# Patient Record
Sex: Male | Born: 2005
Health system: Southern US, Community
[De-identification: ages and names within clinical notes are randomized; demographics above are authoritative.]

---
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
## 2024-06-06 DIAGNOSIS — R6889 Other general symptoms and signs: Secondary | ICD-10-CM | POA: Diagnosis not present

## 2024-06-06 DIAGNOSIS — J101 Influenza due to other identified influenza virus with other respiratory manifestations: Secondary | ICD-10-CM | POA: Diagnosis not present

## 2024-06-13 DIAGNOSIS — J029 Acute pharyngitis, unspecified: Secondary | ICD-10-CM | POA: Diagnosis not present

## 2024-06-13 DIAGNOSIS — R070 Pain in throat: Secondary | ICD-10-CM | POA: Diagnosis not present

## 2024-09-13 DIAGNOSIS — H5213 Myopia, bilateral: Secondary | ICD-10-CM | POA: Diagnosis not present

## 2024-09-13 DIAGNOSIS — H52201 Unspecified astigmatism, right eye: Secondary | ICD-10-CM | POA: Diagnosis not present
